# Patient Record
Sex: Male | Born: 1995 | Race: Black or African American | Hispanic: No | Marital: Single | Smoking: Current every day smoker
Health system: Southern US, Community
[De-identification: ages and names within clinical notes are randomized; demographics above are authoritative.]

---
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
## 2018-05-11 ENCOUNTER — Other Ambulatory Visit: Payer: Self-pay

## 2018-05-11 ENCOUNTER — Emergency Department (HOSPITAL_COMMUNITY)
Admission: EM | Admit: 2018-05-11 | Discharge: 2018-05-11 | Disposition: A | Discharge status: Home / Self Care | Payer: BLUE CROSS/BLUE SHIELD | Attending: Emergency Medicine | Admitting: Emergency Medicine

## 2018-05-11 ENCOUNTER — Encounter (HOSPITAL_COMMUNITY): Payer: Self-pay

## 2018-05-11 DIAGNOSIS — R1012 Left upper quadrant pain: Secondary | ICD-10-CM | POA: Insufficient documentation

## 2018-05-11 LAB — CBC WITH DIFFERENTIAL/PLATELET
BASOS ABS: 0 10*3/uL (ref 0.0–0.1)
Basophils Relative: 0 %
EOS ABS: 0.4 10*3/uL (ref 0.0–0.7)
EOS PCT: 6 %
HCT: 42.2 % (ref 39.0–52.0)
Hemoglobin: 14.6 g/dL (ref 13.0–17.0)
Lymphocytes Relative: 33 %
Lymphs Abs: 2.1 10*3/uL (ref 0.7–4.0)
MCH: 30.3 pg (ref 26.0–34.0)
MCHC: 34.6 g/dL (ref 30.0–36.0)
MCV: 87.6 fL (ref 78.0–100.0)
Monocytes Absolute: 0.4 10*3/uL (ref 0.1–1.0)
Monocytes Relative: 7 %
Neutro Abs: 3.4 10*3/uL (ref 1.7–7.7)
Neutrophils Relative %: 54 %
PLATELETS: 200 10*3/uL (ref 150–400)
RBC: 4.82 MIL/uL (ref 4.22–5.81)
RDW: 13.4 % (ref 11.5–15.5)
WBC: 6.3 10*3/uL (ref 4.0–10.5)

## 2018-05-11 LAB — COMPREHENSIVE METABOLIC PANEL
ALT: 10 U/L (ref 0–44)
AST: 20 U/L (ref 15–41)
Albumin: 3.9 g/dL (ref 3.5–5.0)
Alkaline Phosphatase: 39 U/L (ref 38–126)
Anion gap: 9 (ref 5–15)
BUN: 5 mg/dL — AB (ref 6–20)
CHLORIDE: 108 mmol/L (ref 98–111)
CO2: 28 mmol/L (ref 22–32)
CREATININE: 0.81 mg/dL (ref 0.61–1.24)
Calcium: 9.2 mg/dL (ref 8.9–10.3)
GFR calc Af Amer: >60 mL/min (ref >60)
GFR calc non Af Amer: >60 mL/min (ref >60)
Glucose, Bld: 102 mg/dL — ABNORMAL HIGH (ref 70–99)
Potassium: 3.6 mmol/L (ref 3.5–5.1)
SODIUM: 145 mmol/L (ref 135–145)
Total Bilirubin: 0.9 mg/dL (ref 0.3–1.2)
Total Protein: 6.4 g/dL — ABNORMAL LOW (ref 6.5–8.1)

## 2018-05-11 LAB — URINALYSIS, ROUTINE W REFLEX MICROSCOPIC
BILIRUBIN URINE: NEGATIVE
Bacteria, UA: NONE SEEN
GLUCOSE, UA: NEGATIVE mg/dL
Hgb urine dipstick: NEGATIVE
Ketones, ur: NEGATIVE mg/dL
LEUKOCYTES UA: NEGATIVE
Nitrite: NEGATIVE
PROTEIN: NEGATIVE mg/dL
Specific Gravity, Urine: 1.018 (ref 1.005–1.030)
pH: 6 (ref 5.0–8.0)

## 2018-05-11 LAB — LIPASE, BLOOD: Lipase: 28 U/L (ref 11–51)

## 2018-05-11 MED ORDER — SIMETHICONE 80 MG PO CHEW
160.0000 mg | CHEWABLE_TABLET | Freq: Once | ORAL | Status: AC
  Administered 2018-05-11: 160 mg via ORAL
  Filled 2018-05-11: qty 2

## 2018-05-11 MED ORDER — SODIUM CHLORIDE 0.9 % IV BOLUS
1000.0000 mL | Freq: Once | INTRAVENOUS | Status: AC
  Administered 2018-05-11: 1000 mL via INTRAVENOUS

## 2018-05-11 MED ORDER — GI COCKTAIL ~~LOC~~
30.0000 mL | Freq: Once | ORAL | Status: AC
  Administered 2018-05-11: 30 mL via ORAL
  Filled 2018-05-11: qty 30

## 2018-05-11 MED ORDER — DOCUSATE SODIUM 100 MG PO CAPS
100.0000 mg | ORAL_CAPSULE | Freq: Once | ORAL | Status: AC
  Administered 2018-05-11: 100 mg via ORAL
  Filled 2018-05-11: qty 1

## 2018-05-11 MED ORDER — FAMOTIDINE IN NACL 20-0.9 MG/50ML-% IV SOLN: 20.0000 mg | INTRAVENOUS | Status: DC

## 2018-05-11 NOTE — ED Provider Notes (Signed)
[facility]-EMERGENCY DEPT Provider Note   CSN: [id number] Arrival date & time: 05/11/18  0242     History   Chief Complaint No chief complaint on file.   HPI Stephen Holmes is a 22 y.o. male.  The history is provided by the patient and medical records.    [name] here with upper abdominal pain.  He reports this began last evening, about 6 hours ago.  Reports it feels like a dull, aching/pressure type pain in his left upper abdomen.  He denies nausea, vomiting, diarrhea.  No fever/chills.  States today he ate [employer]onal[employer] for lunch then meal of ribs (spicy), macaroni and cheese, green beans, and cornbread.  States he only ate half his dinner because he felt somewhat bloated.  No diarrhea.  No urinary symptoms.  History reviewed. No pertinent past medical history.  There are no active problems to display for this patient.   History reviewed. No pertinent surgical history.      Home Medications    Prior to Admission medications   Not on File    Family History History reviewed. No pertinent family history.  Social History Social History   Tobacco Use  . Smoking status: Never Smoker  . Smokeless tobacco: Never Used  Substance Use Topics  . Alcohol use: No  . Drug use: No     Allergies   Patient has no known allergies.   Review of Systems Review of Systems  Gastrointestinal: Positive for abdominal pain.  All other systems reviewed and are negative.    Physical Exam Updated Vital Signs BP 137/81 (BP Location: Left Arm)   Pulse 60   Temp 97.9 F (36.6 C) (Oral)   Resp 18   Ht 5\' 10"  (1.778 m)   Wt 67.6 kg   SpO2 100%   BMI 21.38 kg/m   Physical Exam  Constitutional: He is oriented to person, place, and time. He appears well-developed and well-nourished.  HENT:  Head: Normocephalic and atraumatic.  Mouth/Throat: Oropharynx is clear and moist.  Eyes: Pupils are equal, round, and reactive to light. Conjunctivae and EOM are  normal.  Neck: Normal range of motion.  Cardiovascular: Normal rate, regular rhythm and normal heart sounds.  Pulmonary/Chest: Effort normal and breath sounds normal.  Abdominal: Soft. Bowel sounds are normal. There is no tenderness. There is no rigidity and no guarding.  Abdomen soft, normal bowel sounds, points to LUQ as area of pain, no distention  Musculoskeletal: Normal range of motion.  Neurological: He is alert and oriented to person, place, and time.  Skin: Skin is warm and dry.  Psychiatric: He has a normal mood and affect.  Nursing note and vitals reviewed.    ED Treatments / Results  Labs (all labs ordered are listed, but only abnormal results are displayed) Labs Reviewed  COMPREHENSIVE METABOLIC PANEL - Abnormal; Notable for the following components:      Result Value   Glucose, Bld 102 (*)    BUN 5 (*)    Total Protein 6.4 (*)    All other components within normal limits  CBC WITH DIFFERENTIAL/PLATELET  LIPASE, BLOOD  URINALYSIS, ROUTINE W REFLEX MICROSCOPIC    EKG None  Radiology No results found.  Procedures Procedures (including critical care time)  Medications Ordered in ED Medications  docusate sodium (COLACE) capsule 100 mg (has no administration in time range)  sodium chloride 0.9 % bolus 1,000 mL (1,000 mLs Intravenous New Bag/Given 05/11/18 0352)  gi cocktail (Maalox,Lidocaine,Donnatal) (30  mLs Oral Given 05/11/18 0343)  simethicone (MYLICON) chewable tablet 160 mg (160 mg Oral Given 05/11/18 0358)     Initial Impression / Assessment and Plan / ED Course  I have reviewed the triage vital signs and the nursing notes.  Pertinent labs & imaging results that were available during my care of the patient were reviewed by me and considered in my medical decision making (see chart for details).  [name] here with left upper abdominal pain.  Ate mcdonalds and large meal for dinner today.  No nausea, vomiting, or diarrhea.  He is afebrile, non-toxic.  No  focal tenderness or peritoneal signs of exam.  Labs all reassuring.  Patient states he feels like he needs to have a BM.  Reports BM's have been normal lately, last had one yesterday.  May have some mild constipation.  He requested stool softener which was given.  Low suspicion for acute/surgical abdominal process.  Will have him follow-up closely as an outpatient.  Can return here for any new/acute changes.  Final Clinical Impressions(s) / ED Diagnoses   Final diagnoses:  Left upper quadrant pain    ED Discharge Orders    None       Garlon HatchetSanders, Sharicka Pogorzelski M, PA-C 05/11/18 0526    Molpus, Jonny RuizJohn, MD 05/11/18 [phone]
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
## 2018-05-11 NOTE — ED Notes (Signed)
Urine culture sent down with UA. 

## 2018-05-11 NOTE — Discharge Instructions (Signed)
Labs today looked good Please follow-up with a primary care doctor in the area.  You can call the number on back of your insurance card to find clinics that accept your insurance. You can return here for any new/acute changes.

## 2018-05-11 NOTE — ED Notes (Signed)
Bed: WA20 Expected date:  Expected time:  Means of arrival:  Comments: 22 yo M/Upper Abd pain

## 2020-10-10 ENCOUNTER — Ambulatory Visit (INDEPENDENT_AMBULATORY_CARE_PROVIDER_SITE_OTHER): Payer: No Payment, Other | Admitting: Behavioral Health

## 2020-10-10 ENCOUNTER — Other Ambulatory Visit: Payer: Self-pay

## 2020-10-10 DIAGNOSIS — F32A Depression, unspecified: Secondary | ICD-10-CM

## 2020-10-10 NOTE — Progress Notes (Deleted)
Raised by his aunt and uncle ..mom lost custody when he was 25yo. You chose a man over [place] and that's what was in the court documents  Dad in and out of jail mot of my childhood life

## 2020-10-24 ENCOUNTER — Ambulatory Visit (HOSPITAL_COMMUNITY): Payer: No Payment, Other | Admitting: Behavioral Health

## 2020-10-24 ENCOUNTER — Other Ambulatory Visit: Payer: Self-pay

## 2020-11-01 ENCOUNTER — Ambulatory Visit (HOSPITAL_COMMUNITY): Payer: No Payment, Other | Admitting: Behavioral Health

## 2020-11-16 ENCOUNTER — Ambulatory Visit (HOSPITAL_COMMUNITY): Payer: Self-pay | Admitting: Psychiatry

## 2020-11-26 NOTE — Progress Notes (Signed)
Comprehensive Clinical Assessment (CCA) Note  11/26/2020 Stephen Holmes [id number]  Chief Complaint:  Chief Complaint  Patient presents with  . Depression   Visit Diagnosis: Unspecified Depressive Disorder    CCA Screening, Triage and Referral (STR)  Patient Reported Information How did you hear about [place]? No data recorded Referral name: No data recorded Referral phone number: No data recorded  Whom do you see for routine medical problems? No data recorded Practice/Facility Name: No data recorded Practice/Facility Phone Number: No data recorded Name of Contact: No data recorded Contact Number: No data recorded Contact Fax Number: No data recorded Prescriber Name: No data recorded Prescriber Address (if known): No data recorded  What Is the Reason for Your Visit/Call Today? No data recorded How Long Has This Been Causing You Problems? No data recorded What Do You Feel Would Help You the Most Today? No data recorded  Have You Recently Been in Any Inpatient Treatment (Hospital/Detox/Crisis Center/28-Day Program)? No data recorded Name/Location of Program/Hospital:No data recorded How Long Were You There? No data recorded When Were You Discharged? No data recorded  Have You Ever Received Services From [facility] Before? No data recorded Who Do You See at [facility]? No data recorded  Have You Recently Had Any Thoughts About Hurting Yourself? No data recorded Are You Planning to Commit Suicide/Harm Yourself At This time? No data recorded  Have you Recently Had Thoughts About Hurting Someone [name]? No data recorded Explanation: No data recorded  Have You Used Any Alcohol or Drugs in the Past 24 Hours? No data recorded How Long Ago Did You Use Drugs or Alcohol? No data recorded What Did You Use and How Much? No data recorded  Do You Currently Have a Therapist/Psychiatrist? No data recorded Name of Therapist/Psychiatrist: No data recorded  Have You Been Recently  Discharged From Any Office Practice or Programs? No data recorded Explanation of Discharge From Practice/Program: No data recorded    CCA Screening Triage Referral Assessment Type of Contact: No data recorded Is this Initial or Reassessment? No data recorded Date Telepsych consult ordered in [facility]:  No data recorded Time Telepsych consult ordered in [facility]:  No data recorded  Patient Reported Information Reviewed? No data recorded Patient Left Without Being Seen? No data recorded Reason for Not Completing Assessment: No data recorded  Collateral Involvement: No data recorded  Does Patient Have a Court Appointed Legal Guardian? No data recorded Name and Contact of Legal Guardian: No data recorded If Minor and Not Living with Parent(s), Who has Custody? No data recorded Is CPS involved or ever been involved? No data recorded Is APS involved or ever been involved? No data recorded  Patient Determined To Be At Risk for Harm To Self or Others Based on Review of Patient Reported Information or Presenting Complaint? No data recorded Method: No data recorded Availability of Means: No data recorded Intent: No data recorded Notification Required: No data recorded Additional Information for Danger to Others Potential: No data recorded Additional Comments for Danger to Others Potential: No data recorded Are There Guns or Other Weapons in Your Home? No data recorded Types of Guns/Weapons: No data recorded Are These Weapons Safely Secured?                            No data recorded Who Could Verify You Are Able To Have These Secured: No data recorded Do You Have any Outstanding Charges, Pending Court Dates, Parole/Probation? No data  recorded Contacted To Inform of Risk of Harm To Self or Others: No data recorded  Location of Assessment: No data recorded  Does Patient Present under Involuntary Commitment? No data recorded IVC Papers Initial File Date: No data recorded  [place] of Residence: No  data recorded  Patient Currently Receiving the Following Services: No data recorded  Determination of Need: No data recorded  Options For Referral: No data recorded    CCA Biopsychosocial Intake/Chief Complaint:  No data recorded Current Symptoms/Problems: No data recorded  Patient Reported Schizophrenia/Schizoaffective Diagnosis in Past: No data recorded  Strengths: No data recorded Preferences: No data recorded Abilities: No data recorded  Type of Services Patient Feels are Needed: No data recorded  Initial Clinical Notes/Concerns: No data recorded  Mental Health Symptoms Depression:  Hopelessness; Worthlessness; Tearfulness; Irritability   Duration of Depressive symptoms: Less than two weeks (1 day)   Mania:  None   Anxiety:   None   Psychosis:  None   Duration of Psychotic symptoms: No data recorded  Trauma:  None   Obsessions:  None   Compulsions:  None   Inattention:  None   Hyperactivity/Impulsivity:  N/A   Oppositional/Defiant Behaviors:  None   Emotional Irregularity:  None   Other Mood/Personality Symptoms:  None    Mental Status Exam Appearance and self-care  Stature:  Average   Weight:  Average weight   Clothing:  Age-appropriate   Grooming:  Normal   Cosmetic use:  None   Posture/gait:  Normal   Motor activity:  Not Remarkable   Sensorium  Attention:  Normal   Concentration:  Normal   Orientation:  X5   Recall/memory:  Normal   Affect and Mood  Affect:  Appropriate   Mood:  Other (Comment) (Pleasant)   Relating  Eye contact:  Normal   Facial expression:  Responsive   Attitude toward examiner:  Cooperative   Thought and Language  Speech flow: Clear and Coherent   Thought content:  Appropriate to Mood and Circumstances   Preoccupation:  None   Hallucinations:  None   Organization:  No data recorded  [employer] of Knowledge:  Fair   Intelligence:  Average   Abstraction:  Normal    Judgement:  No data recorded  Reality Testing:  No data recorded  Insight:  Fair   Decision Making:  Impulsive   Social Functioning  Social Maturity:  Impulsive   Social Judgement:  Heedless   Stress  Stressors:  Optometrist Ability:  Deficient supports   Skill Deficits:  Self-control   Supports:  Support needed     Religion: Religion/Spirituality Are You A Religious Person?: Yes What is Your Religious Affiliation?: Christian How Might This Affect Treatment?: None  Leisure/Recreation: Leisure / Recreation Do You Have Hobbies?: Yes Leisure and Hobbies: "Listen to music and going to hang out with my friends."  Exercise/Diet: Exercise/Diet Do You Exercise?: No Have You Gained or Lost A Significant Amount of Weight in the Past Six Months?: No Do You Follow a Special Diet?: No Do You Have Any Trouble Sleeping?: No   CCA Employment/Education Employment/Work Situation: Employment / Work Psychologist, occupational Employment situation: Unemployed Patient's job has been impacted by current illness: No What is the longest time patient has a held a job?: [employer] Where was the patient employed at that time?: 3 years Has patient ever been in the [employer]?: No  Education: Education Is Patient Currently Attending School?: No Last Grade Completed: 12 Name of  High School: [employer] Did Garment/textile technologist From [employer]?: Yes Did Theme park manager?: No Did You Attend Graduate School?: No Did You Have Any Special Interests In School?: None Did You Have An Individualized Education Program (IIEP): No Did You Have Any Difficulty At School?: No Patient's Education Has Been Impacted by Current Illness: No   CCA Family/Childhood History Family and Relationship History: Family history Marital status: Single Are you sexually active?: Yes What is your sexual orientation?: Heterosexual Does patient have children?: No  Childhood History:  Childhood History By whom was/is the  patient raised?: Other (Comment) Does patient have siblings?: Yes Number of Siblings: 2 Description of patient's current relationship with siblings: 1 brother (26yo) - in the [employer] & 1 sister (28yo) ... "Me and my brother we get into arguments." Did patient suffer any verbal/emotional/physical/sexual abuse as a child?: No Did patient suffer from severe childhood neglect?: No Has patient ever been sexually abused/assaulted/raped as an adolescent or adult?: No Was the patient ever a victim of a crime or a disaster?: No Witnessed domestic violence?: No Has patient been affected by domestic violence as an adult?: No  Child/Adolescent Assessment: N/A     CCA Substance Use Alcohol/Drug Use: Alcohol / Drug Use Pain Medications: See MAR Prescriptions: See MAR Over the Counter: See MAR History of alcohol / drug use?: Yes Longest period of sobriety (when/how long): [facility]N Negative Consequences of Use: Personal relationships,Legal Withdrawal Symptoms: Other (Comment) (None) Substance #1 Name of Substance 1: Cannabis 1 - Age of First Use: 18 1 - Amount (size/oz): 3 blunts 1 - Frequency: 2x a week 1 - Duration: Years 1 - Last Use / Amount: 10/06/2020    ASAM's:  Six Dimensions of Multidimensional Assessment  Dimension 1:  Acute Intoxication and/or Withdrawal Potential:      Dimension 2:  Biomedical Conditions and Complications:      Dimension 3:  Emotional, Behavioral, or Cognitive Conditions and Complications:     Dimension 4:  Readiness to Change:     Dimension 5:  Relapse, Continued use, or Continued Problem Potential:     Dimension 6:  Recovery/Living Environment:     ASAM Severity Score:    ASAM Recommended Level of Treatment:     Substance use Disorder (SUD)    Recommendations for Services/Supports/Treatments: Recommendations for Services/Supports/Treatments Recommendations For Services/Supports/Treatments: Individual Therapy,Medication Management  DSM5 Diagnoses: There  are no problems to display for this patient.    Mamie Nick, Counselor

## 2020-11-29 ENCOUNTER — Ambulatory Visit (HOSPITAL_COMMUNITY): Payer: Self-pay | Admitting: Psychiatry

## 2020-12-11 ENCOUNTER — Emergency Department (HOSPITAL_COMMUNITY)
Admission: EM | Admit: 2020-12-11 | Discharge: 2020-12-11 | Discharge status: Against Medical Advice | Payer: BLUE CROSS/BLUE SHIELD

## 2020-12-11 NOTE — ED Notes (Signed)
Pt did not respond when called for triage, is not seen in or outside of lobby

## 2020-12-11 NOTE — ED Notes (Signed)
Called x 4

## 2022-09-13 ENCOUNTER — Ambulatory Visit (HOSPITAL_COMMUNITY)
Admission: EM | Admit: 2022-09-13 | Discharge: 2022-09-13 | Disposition: A | Discharge status: Home / Self Care | Payer: No Payment, Other | Attending: Psychiatry | Admitting: Psychiatry

## 2022-09-13 DIAGNOSIS — Z008 Encounter for other general examination: Secondary | ICD-10-CM | POA: Insufficient documentation

## 2022-09-13 NOTE — Discharge Instructions (Addendum)
Discharge recommendations:   Outpatient Follow up: Please review list of outpatient resources for psychiatry and counseling for a comprehensive clinical assessment.   Therapy: We recommend that patient participate in individual therapy to address mental health concerns.  Safety:   The following safety precautions should be taken:   No sharp objects. This includes scissors, razors, scrapers, and putty knives.   Chemicals should be removed and locked up.   Medications should be removed and locked up.   Weapons should be removed and locked up. This includes firearms, knives and instruments that can be used to cause injury.   The patient should abstain from use of illicit substances/drugs and abuse of any medications.  If symptoms worsen or do not continue to improve or if the patient becomes actively suicidal or homicidal then it is recommended that the patient return to the closest hospital emergency department, the [facility], or call 911 for further evaluation and treatment. National Suicide Prevention Lifeline 1-800-SUICIDE or 614-089-7550.  About 988 988 offers 24/7 access to trained crisis counselors who can help people experiencing mental health-related distress. People can call or text 988 or chat 988lifeline.org for themselves or if they are worried about a loved one who may need crisis support.

## 2022-09-13 NOTE — ED Provider Notes (Signed)
Behavioral Health Urgent Care Medical Screening Exam  Patient Name: Stephen Holmes MRN: [id number] Date of Evaluation: 09/13/22 Chief Complaint:   Diagnosis:  Final diagnoses:  Evaluation by psychiatric service required    History of Present illness: Stephen Holmes is a 26 y.o. male patient with no past psychiatric history who presented to the [facility] behavioral health urgent care voluntary accompanied by his aunt with a compliant of, referred by DSS for a mental health evaluation.   On evaluation, patient is alert and oriented x 4. His thought process is linear and speech is clear and coherent at a moderate tone. His mood is euthymic and affect is congruent. He has fair eye contact. He is calm and cooperative. He appears well groomed and is casually dressed.  Patient states that he was referred by DSS for a mental health assessment to get custody of his 66 year old son. He states that his visitation rights were taken away because he wasn't seeing his son regularly and went to jail for five months this year for breaking and entering.   He denies SI/HI/AVH. There is no objective evidence that the patient is currently responding to internal or external stimuli, or experiencing paranoia or delusional thought content. He denies depressive symptoms. He denies symptoms of mania. He denies current substance use. He states that he stopped using marijuana and cocaine in June of this year. He first started using marijuana at age 63 years old. He first started using cocaine this year. He denies drinking alcohol. He is currently unemployed. He resides with his aunt. He has two children, son age 69 yrs old and daughter age 66 months old. No past psychiatric history. No past inpatient psychiatric treatment. No family history of mental illness.   Patient advised to follow up here at the [facility] outpatient clinic for a comprehensive clinical assessment. Patient was provided with the days and times for open  access.   Psychiatric Specialty Exam  Presentation  General Appearance:Appropriate for Environment  Eye Contact:Fair  Speech:Blocked  Speech Volume:Normal  Handedness:No data recorded  Mood and Affect  Mood:Euthymic  Affect:Congruent   Thought Process  Thought Processes:Coherent  Descriptions of Associations:Intact  Orientation:Full (Time, Place and Person)  Thought Content:WDL    Hallucinations:None  Ideas of Reference:None  Suicidal Thoughts:No  Homicidal Thoughts:No   Sensorium  Memory:Immediate Fair; Recent Fair; Remote Good  Judgment:Fair  Insight:Fair   Executive Functions  Concentration:Fair  Attention Span:Fair  Recall:Fair  Fund of Knowledge:Fair  Language:Fair   Psychomotor Activity  Psychomotor Activity:Normal   Assets  Assets:Communication Skills; Desire for Improvement; Leisure Time; Housing; Social Support   Sleep  Sleep:Fair  Number of hours: No data recorded  No data recorded  Physical Exam: Physical Exam HENT:     Head: Normocephalic.     Nose: Nose normal.  Cardiovascular:     Rate and Rhythm: Normal rate.  Musculoskeletal:        General: Normal range of motion.     Cervical back: Normal range of motion.  Neurological:     Mental Status: He is alert and oriented to person, place, and time.    Review of Systems  Constitutional: Negative.   HENT: Negative.    Eyes: Negative.   Respiratory: Negative.    Cardiovascular: Negative.   Gastrointestinal: Negative.   Genitourinary: Negative.   Musculoskeletal: Negative.   Neurological: Negative.    Blood pressure 131/78, pulse 69, temperature 98.4 F (36.9 C), temperature source Oral, resp. rate 18, SpO2  100 %. There is no height or weight on file to calculate BMI.  Musculoskeletal: Strength & Muscle Tone: within normal limits Gait & Station: normal Patient leans: N/A   [facility] MSE Discharge Disposition for Follow up and Recommendations: Based on my  evaluation the patient does not appear to have an emergency medical condition and can be discharged with resources and follow up care in outpatient services for Individual Therapy  Discharge recommendations:   Outpatient Follow up: Please review list of outpatient resources for psychiatry and counseling for a comprehensive clinical assessment.   Therapy: We recommend that patient participate in individual therapy to address mental health concerns.  Safety:   The following safety precautions should be taken:   No sharp objects. This includes scissors, razors, scrapers, and putty knives.   Chemicals should be removed and locked up.   Medications should be removed and locked up.   Weapons should be removed and locked up. This includes firearms, knives and instruments that can be used to cause injury.   The patient should abstain from use of illicit substances/drugs and abuse of any medications.  If symptoms worsen or do not continue to improve or if the patient becomes actively suicidal or homicidal then it is recommended that the patient return to the closest hospital emergency department, the [facility], or call 911 for further evaluation and treatment. National Suicide Prevention Lifeline 1-800-SUICIDE or 616-309-9621.  About 988 988 offers 24/7 access to trained crisis counselors who can help people experiencing mental health-related distress. People can call or text 988 or chat 988lifeline.org for themselves or if they are worried about a loved one who may need crisis support.     Layla Barter, NP 09/13/2022, 9:53 AM

## 2022-09-13 NOTE — BH Assessment (Addendum)
CPS is requiring pt to have a mental health assessment. Pt reports he is trying to get custody of his son. Pt denies SI, HI, AVH reports he has been sober for 5 months was using THC and cocaine.

## 2022-09-17 ENCOUNTER — Ambulatory Visit (INDEPENDENT_AMBULATORY_CARE_PROVIDER_SITE_OTHER): Payer: BLUE CROSS/BLUE SHIELD | Admitting: Licensed Clinical Social Worker

## 2022-09-17 ENCOUNTER — Ambulatory Visit (INDEPENDENT_AMBULATORY_CARE_PROVIDER_SITE_OTHER): Payer: Commercial Managed Care - HMO | Admitting: Psychiatry

## 2022-09-17 ENCOUNTER — Encounter (HOSPITAL_COMMUNITY): Payer: Self-pay

## 2022-09-17 VITALS — BP 123/85 | HR 53

## 2022-09-17 DIAGNOSIS — F1411 Cocaine abuse, in remission: Secondary | ICD-10-CM

## 2022-09-17 DIAGNOSIS — Z7689 Persons encountering health services in other specified circumstances: Secondary | ICD-10-CM | POA: Diagnosis not present

## 2022-09-17 DIAGNOSIS — F1211 Cannabis abuse, in remission: Secondary | ICD-10-CM | POA: Diagnosis not present

## 2022-09-17 DIAGNOSIS — F4322 Adjustment disorder with anxiety: Secondary | ICD-10-CM | POA: Diagnosis not present
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
## 2022-09-27 ENCOUNTER — Encounter (HOSPITAL_COMMUNITY): Payer: Self-pay | Admitting: Psychiatry

## 2022-10-16 ENCOUNTER — Ambulatory Visit (HOSPITAL_COMMUNITY): Payer: Self-pay | Admitting: Licensed Clinical Social Worker

## 2022-11-05 ENCOUNTER — Ambulatory Visit (HOSPITAL_COMMUNITY): Payer: Self-pay | Admitting: Licensed Clinical Social Worker

## 2022-12-12 ENCOUNTER — Emergency Department (HOSPITAL_COMMUNITY)
Admission: EM | Admit: 2022-12-12 | Discharge: 2022-12-12 | Disposition: A | Discharge status: Home / Self Care | Payer: BLUE CROSS/BLUE SHIELD | Attending: Emergency Medicine | Admitting: Emergency Medicine

## 2022-12-12 ENCOUNTER — Encounter (HOSPITAL_COMMUNITY): Payer: Self-pay

## 2022-12-12 ENCOUNTER — Other Ambulatory Visit: Payer: Self-pay

## 2022-12-12 DIAGNOSIS — R300 Dysuria: Secondary | ICD-10-CM | POA: Insufficient documentation

## 2022-12-12 LAB — CBC
HCT: 39.7 % (ref 39.0–52.0)
Hemoglobin: 13.4 g/dL (ref 13.0–17.0)
MCH: 29.6 pg (ref 26.0–34.0)
MCHC: 33.8 g/dL (ref 30.0–36.0)
MCV: 87.8 fL (ref 80.0–100.0)
Platelets: 263 10*3/uL (ref 150–400)
RBC: 4.52 MIL/uL (ref 4.22–5.81)
RDW: 13.9 % (ref 11.5–15.5)
WBC: 8.4 10*3/uL (ref 4.0–10.5)
nRBC: 0 % (ref 0.0–0.2)

## 2022-12-12 LAB — BASIC METABOLIC PANEL
Anion gap: 8 (ref 5–15)
BUN: 8 mg/dL (ref 6–20)
CO2: 22 mmol/L (ref 22–32)
Calcium: 8.5 mg/dL — ABNORMAL LOW (ref 8.9–10.3)
Chloride: 107 mmol/L (ref 98–111)
Creatinine, Ser: 0.95 mg/dL (ref 0.61–1.24)
GFR, Estimated: >60 mL/min (ref >60)
Glucose, Bld: 89 mg/dL (ref 70–99)
Potassium: 3.9 mmol/L (ref 3.5–5.1)
Sodium: 137 mmol/L (ref 135–145)

## 2022-12-12 LAB — URINALYSIS, ROUTINE W REFLEX MICROSCOPIC
Bilirubin Urine: NEGATIVE
Glucose, UA: NEGATIVE mg/dL
Ketones, ur: NEGATIVE mg/dL
Nitrite: NEGATIVE
Protein, ur: NEGATIVE mg/dL
Specific Gravity, Urine: 1.02 (ref 1.005–1.030)
pH: 7.5 (ref 5.0–8.0)

## 2022-12-12 LAB — RPR: RPR Ser Ql: NONREACTIVE

## 2022-12-12 LAB — URINALYSIS, MICROSCOPIC (REFLEX): WBC, UA: >50 WBC/hpf (ref 0–5)

## 2022-12-12 LAB — RAPID HIV SCREEN (HIV 1/2 AB+AG)
HIV 1/2 Antibodies: NONREACTIVE
HIV-1 P24 Antigen - HIV24: NONREACTIVE

## 2022-12-12 MED ORDER — AZITHROMYCIN 250 MG PO TABS
1000.0000 mg | ORAL_TABLET | Freq: Every day | ORAL | Status: DC
  Administered 2022-12-12: 1000 mg via ORAL
  Filled 2022-12-12: qty 4

## 2022-12-12 MED ORDER — CEFTRIAXONE SODIUM 500 MG IJ SOLR
500.0000 mg | INTRAMUSCULAR | Status: DC
  Administered 2022-12-12: 500 mg via INTRAMUSCULAR
  Filled 2022-12-12: qty 500

## 2022-12-12 MED ORDER — STERILE WATER FOR INJECTION IJ SOLN
INTRAMUSCULAR | Status: AC
  Administered 2022-12-12: 1 mL
  Filled 2022-12-12: qty 10

## 2022-12-12 MED ORDER — METRONIDAZOLE 500 MG PO TABS
2000.0000 mg | ORAL_TABLET | Freq: Once | ORAL | Status: AC
  Administered 2022-12-12: 2000 mg via ORAL
  Filled 2022-12-12: qty 4

## 2022-12-12 NOTE — ED Triage Notes (Addendum)
Pt c/o of burning while urination.Symptoms have been going on for 2 weeks,PT states that this happens everytime he urinates but when he drinks water, the burning subsides a little.Pt states he notices a yellow stain in underwear but denies its urine, this has been going on x 2weeks as well.

## 2022-12-12 NOTE — Discharge Instructions (Signed)
Please follow up with your primary doctor if your urinary symptoms do not improve or if you start having fevers, are not able to urinate, or have severe pain.

## 2022-12-12 NOTE — ED Provider Notes (Signed)
[facility] Provider Note   CSN: [id number] Arrival date & time: 12/12/22  [id number]     History  Chief Complaint  Patient presents with   Dysuria    Stephen Holmes is a 27 y.o. male with no pertinent PMH presenting for 2 weeks of dysuria.  He has also had a yellowish penile discharge.  No bloody discharge.  No abdominal pain, nausea or vomiting, flank pain, testicular pain, fevers at home.  Denies any new sexual partners, but states that he was previously sexually active.   Home Medications Prior to Admission medications   Not on File      Allergies    Patient has no known allergies.    Review of Systems     Physical Exam Updated Vital Signs BP 124/80 (BP Location: Right Arm)   Pulse 78   Temp 98.2 F (36.8 C)   Resp 16   Ht '5\' 10"'$  (1.778 m)   Wt 67.6 kg   SpO2 100%   BMI 21.38 kg/m  Physical Exam Constitutional:      Appearance: Normal appearance. He is not ill-appearing.  Cardiovascular:     Rate and Rhythm: Normal rate and regular rhythm.     Heart sounds: Normal heart sounds.  Pulmonary:     Effort: Pulmonary effort is normal. No respiratory distress.     Breath sounds: Normal breath sounds.  Abdominal:     General: There is no distension.     Palpations: Abdomen is soft.     Tenderness: There is no abdominal tenderness. There is no right CVA tenderness or left CVA tenderness.  Skin:    General: Skin is warm and dry.     Findings: No rash.  Neurological:     Mental Status: He is alert.     ED Results / Procedures / Treatments   Labs (all labs ordered are listed, but only abnormal results are displayed) Labs Reviewed  URINALYSIS, ROUTINE W REFLEX MICROSCOPIC - Abnormal; Notable for the following components:      Result Value   APPearance HAZY (*)    Hgb urine dipstick TRACE (*)    Leukocytes,Ua MODERATE (*)    All other components within normal limits  BASIC METABOLIC PANEL - Abnormal; Notable for  the following components:   Calcium 8.5 (*)    All other components within normal limits  URINALYSIS, MICROSCOPIC (REFLEX) - Abnormal; Notable for the following components:   Bacteria, UA FEW (*)    Non Squamous Epithelial PRESENT (*)    All other components within normal limits  CBC  RPR  RAPID HIV SCREEN (HIV 1/2 AB+AG)  URINE CYTOLOGY ANCILLARY ONLY    EKG None  Radiology No results found.  Procedures   Medications Ordered in ED Medications  azithromycin (ZITHROMAX) tablet 1,000 mg (has no administration in time range)  cefTRIAXone (ROCEPHIN) injection 500 mg (has no administration in time range)  metroNIDAZOLE (FLAGYL) tablet 2,000 mg (has no administration in time range)    ED Course/ Medical Decision Making/ A&P                            Medical Decision Making Amount and/or Complexity of Data Reviewed Labs: ordered.  Risk Prescription drug management.   27 year old male with no pertinent past medical history presenting with 2 weeks of dysuria and penile discharge.  Denies any new sexual partners.  No abdominal pain, CVA tenderness,  testicular pain, or fevers.  Afebrile without leukocytosis here.  UA shows trace Hgb, moderate leukocytes, WBCs, few bacteria with non-squamous epithelium, and white blood cell clumps.  High suspicion for STI.  Will send out GC, RPR, HIV after obtaining consent from patient and treat empirically in the ED.  He will be discharged with outpatient follow-up as needed.  Return precautions provided.  Final Clinical Impression(s) / ED Diagnoses Final diagnoses:  Dysuria    Rx / DC Orders ED Discharge Orders     None         Linus Galas, MD 12/12/22 [place], [place], DO 12/12/22 1047

## 2022-12-16 LAB — URINE CYTOLOGY ANCILLARY ONLY
Bacterial Vaginitis-Urine: NEGATIVE
Chlamydia: POSITIVE — AB
Comment: NEGATIVE
Comment: NEGATIVE
Comment: NORMAL
Neisseria Gonorrhea: POSITIVE — AB
Trichomonas: NEGATIVE

## 2024-05-17 ENCOUNTER — Emergency Department (HOSPITAL_COMMUNITY)
Admission: EM | Admit: 2024-05-17 | Discharge: 2024-05-17 | Discharge status: Against Medical Advice | Payer: Self-pay | Attending: Emergency Medicine | Admitting: Emergency Medicine

## 2024-05-17 ENCOUNTER — Other Ambulatory Visit: Payer: Self-pay

## 2024-05-17 DIAGNOSIS — Z5321 Procedure and treatment not carried out due to patient leaving prior to being seen by health care provider: Secondary | ICD-10-CM | POA: Diagnosis not present

## 2024-05-17 DIAGNOSIS — M549 Dorsalgia, unspecified: Secondary | ICD-10-CM | POA: Diagnosis present

## 2024-05-17 NOTE — ED Triage Notes (Signed)
 Patient to ED by EMS with c/o back pain since July 152/98 114 18 95% RA CBG: 105
